# Patient Record
Sex: Male | Born: 1980
Health system: Southern US, Community
[De-identification: ages and names within clinical notes are randomized; demographics above are authoritative.]

---

## 2014-11-08 ENCOUNTER — Emergency Department (HOSPITAL_COMMUNITY): Payer: Worker's Compensation

## 2014-11-08 ENCOUNTER — Emergency Department (HOSPITAL_COMMUNITY)
Admission: EM | Admit: 2014-11-08 | Discharge: 2014-11-08 | Disposition: A | Discharge status: Home / Self Care | Payer: Worker's Compensation | Attending: Emergency Medicine | Admitting: Emergency Medicine

## 2014-11-08 ENCOUNTER — Encounter (HOSPITAL_COMMUNITY): Payer: Self-pay

## 2014-11-08 DIAGNOSIS — W260XXA Contact with knife, initial encounter: Secondary | ICD-10-CM | POA: Insufficient documentation

## 2014-11-08 DIAGNOSIS — Y9389 Activity, other specified: Secondary | ICD-10-CM | POA: Insufficient documentation

## 2014-11-08 DIAGNOSIS — S61012A Laceration without foreign body of left thumb without damage to nail, initial encounter: Secondary | ICD-10-CM | POA: Insufficient documentation

## 2014-11-08 DIAGNOSIS — Y929 Unspecified place or not applicable: Secondary | ICD-10-CM | POA: Insufficient documentation

## 2014-11-08 DIAGNOSIS — Z23 Encounter for immunization: Secondary | ICD-10-CM | POA: Diagnosis not present

## 2014-11-08 DIAGNOSIS — Y990 Civilian activity done for income or pay: Secondary | ICD-10-CM | POA: Diagnosis not present

## 2014-11-08 DIAGNOSIS — S61412A Laceration without foreign body of left hand, initial encounter: Secondary | ICD-10-CM

## 2014-11-08 MED ORDER — LIDOCAINE HCL (PF) 1 % IJ SOLN
5.0000 mL | Freq: Once | INTRAMUSCULAR | Status: AC
  Administered 2014-11-08: 5 mL
  Filled 2014-11-08: qty 5

## 2014-11-08 MED ORDER — TRAMADOL HCL 50 MG PO TABS
50.0000 mg | ORAL_TABLET | Freq: Once | ORAL | Status: AC
  Administered 2014-11-08: 50 mg via ORAL
  Filled 2014-11-08: qty 1

## 2014-11-08 MED ORDER — BACITRACIN 500 UNIT/GM EX OINT
1.0000 "application " | TOPICAL_OINTMENT | Freq: Once | CUTANEOUS | Status: AC
  Administered 2014-11-08: 1 via TOPICAL
  Filled 2014-11-08: qty 0.9

## 2014-11-08 MED ORDER — TETANUS-DIPHTH-ACELL PERTUSSIS 5-2.5-18.5 LF-MCG/0.5 IM SUSP
0.5000 mL | Freq: Once | INTRAMUSCULAR | Status: AC
  Administered 2014-11-08: 0.5 mL via INTRAMUSCULAR
  Filled 2014-11-08: qty 0.5

## 2014-11-08 MED ORDER — TRAMADOL HCL 50 MG PO TABS: 50.0000 mg | ORAL_TABLET | Freq: Four times a day (QID) | ORAL | Status: AC | PRN

## 2014-11-08 NOTE — ED Notes (Signed)
PA at the bedside.

## 2014-11-08 NOTE — ED Notes (Signed)
Pt. States he was working and cut his left thumb with a knife, bandage on and bleeding controlled in triage. Able to move all fingers, cap refill <3 sec.

## 2014-11-08 NOTE — ED Provider Notes (Signed)
CSN: [id number]     Arrival date & time 11/08/14  2041 History  This chart was scribed for Tyler FinnerErin O'[name], PA-C, working with Tyler RudeNathan R. Miller PayorPickering, MD by Chestine SporeSoijett Miller, ED Scribe. The patient was seen in room TR10C/TR10C at 9:39 PM.    Chief Complaint  Patient presents with  . Extremity Laceration      The history is provided by the patient. No language interpreter was used.    HPI Comments: Tyler Miller is a 34 y.o. male who presents to the Emergency Department complaining of extremity laceration of the left thumb onset tonight 7:30 PM PTA. Pt was working and cut his left thumb with a knife while cutting plastic. Pt rates the pain as 6/10. Pt denies the knife going deep. Pt denies any other cuts. He denies numbness/tingling, and any other symptoms. Pt is not sure of his last tetanus shot. Denies any allergies to medications. Pt has not had stitches before. Pt denies having a PCP. Pt sells plastic.    History reviewed. No pertinent past medical history. History reviewed. No pertinent past surgical history. No family history on file. History  Substance Use Topics  . Smoking status: Not on file  . Smokeless tobacco: Not on file  . Alcohol Use: Not on file    Review of Systems  Skin: Positive for wound. Negative for color change.      Allergies  Review of patient's allergies indicates no known allergies.  Home Medications   Prior to Admission medications   Not on File   BP 137/86 mmHg  Pulse 71  Temp(Src) 99.4 F (37.4 C) (Oral)  Resp 22  SpO2 99%  Physical Exam  Constitutional: He is oriented to person, place, and time. He appears well-developed and well-nourished.  HENT:  Head: Normocephalic and atraumatic.  Eyes: EOM are normal.  Neck: Normal range of motion.  Cardiovascular: Normal rate.   Cap refill is less than 3 seconds  Pulmonary/Chest: Effort normal.  Musculoskeletal: Normal range of motion.  Left thumb: Full ROM. 4/5 strength due to pain.   Neurological: He  is alert and oriented to person, place, and time. No sensory deficit.  Sensation intact  Skin: Skin is warm and dry. Laceration noted. No bruising noted.  Left hand: base of the left thumb dorsal aspect, 1 cm linear laceration. Bleeding controlled with pressure. No tendon involvement. Small arterial bleed, that is controlled with pressure. No bruising. No foreign bodies seen.   Psychiatric: He has a normal mood and affect. His behavior is normal.  Nursing note and vitals reviewed.   ED Course  Procedures   LACERATION REPAIR Performed by: Tyler Miller'[name], Kahleb Mcclane A. Authorized by: Ina Homes'[name], [name] A. Consent: Verbal consent obtained. Risks and benefits: risks, benefits and alternatives were discussed Consent given by: patient Patient identity confirmed: provided demographic data Prepped and Draped in normal sterile fashion Wound explored  Laceration Location: base of left thumb, dorsal aspect  Laceration Length: 1cm  No Foreign Bodies seen or palpated  Anesthesia: local infiltration  Local anesthetic: lidocaine 1% without epinephrine  Anesthetic total: 0.5 ml  Irrigation method: syringe Amount of cleaning: standard  Skin closure: 4-0 prolene  Number of sutures: 3  Technique: interrupted   Patient tolerance: Patient tolerated the procedure well with no immediate complications.   DIAGNOSTIC STUDIES: Oxygen Saturation is 99% on room air, normal by my interpretation.    COORDINATION OF CARE: 9:50 PM-Discussed treatment plan which includes left hand x-ray, laceration repair, f/u in 7-10 days for suture  removal with pt at bedside and pt agreed to plan.   Labs Review Labs Reviewed - No data to display  Imaging Review Dg Hand Complete Left  11/08/2014   CLINICAL DATA:  Cut left posterior thumb with a knife. Initial encounter.  EXAM: LEFT HAND - COMPLETE 3+ VIEW  COMPARISON:  None.  FINDINGS: There is no evidence of osseous disruption. No radiopaque foreign bodies are seen. A  dressing is noted overlying the site of laceration. Soft tissue disruption is not well characterized on radiograph.  Visualized joint spaces are grossly preserved. The carpal rows appear grossly intact, with a subcortical cyst noted at the distal scaphoid.  IMPRESSION: No evidence of osseous disruption. No radiopaque foreign bodies seen.   Electronically Signed   By: [name] M.D.   On: 11/08/2014 21:29     EKG Interpretation None      MDM   Final diagnoses:  None    Laceration to base of left thumb, dorsal aspect. Left hand and thumb neurovascularly in tact. Tdap given in ED.  Laceration sutured w/o immediate complication. Home care instructions provided. Advised to have sutures removed in 7-10 days. Return precautions provided. Pt verbalized understanding and agreement with tx plan.   I personally performed the services described in this documentation, which was scribed in my presence. The recorded information has been reviewed and is accurate.   Tyler Finner, PA-C 11/08/14 2229  Tyler Miller. Miller Payor, MD 11/09/14 [phone]

## 2016-01-08 IMAGING — CR DG HAND COMPLETE 3+V*L*
3 series · 3 of 3 positions shown · non-contrast
Comparison: None.

CLINICAL DATA: Cut left posterior thumb with a knife. Initial
encounter.

EXAM:
LEFT HAND - COMPLETE 3+ VIEW

[x hand pa left]
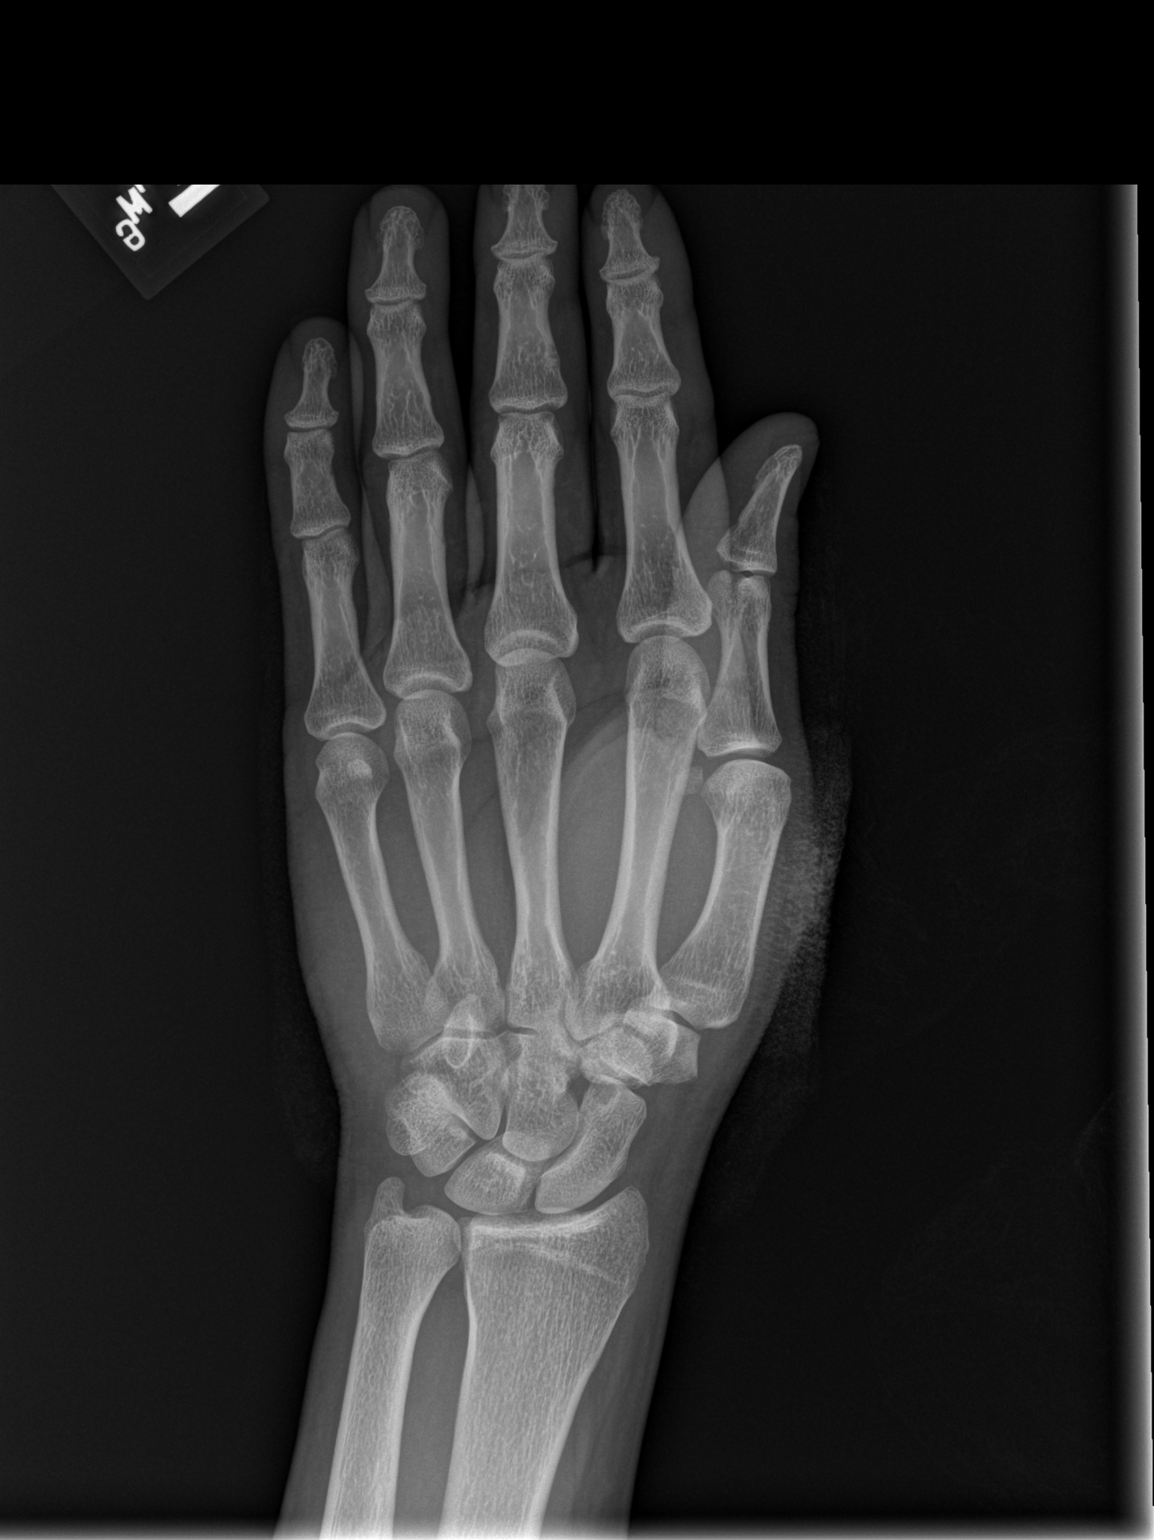

[x hand obl left]
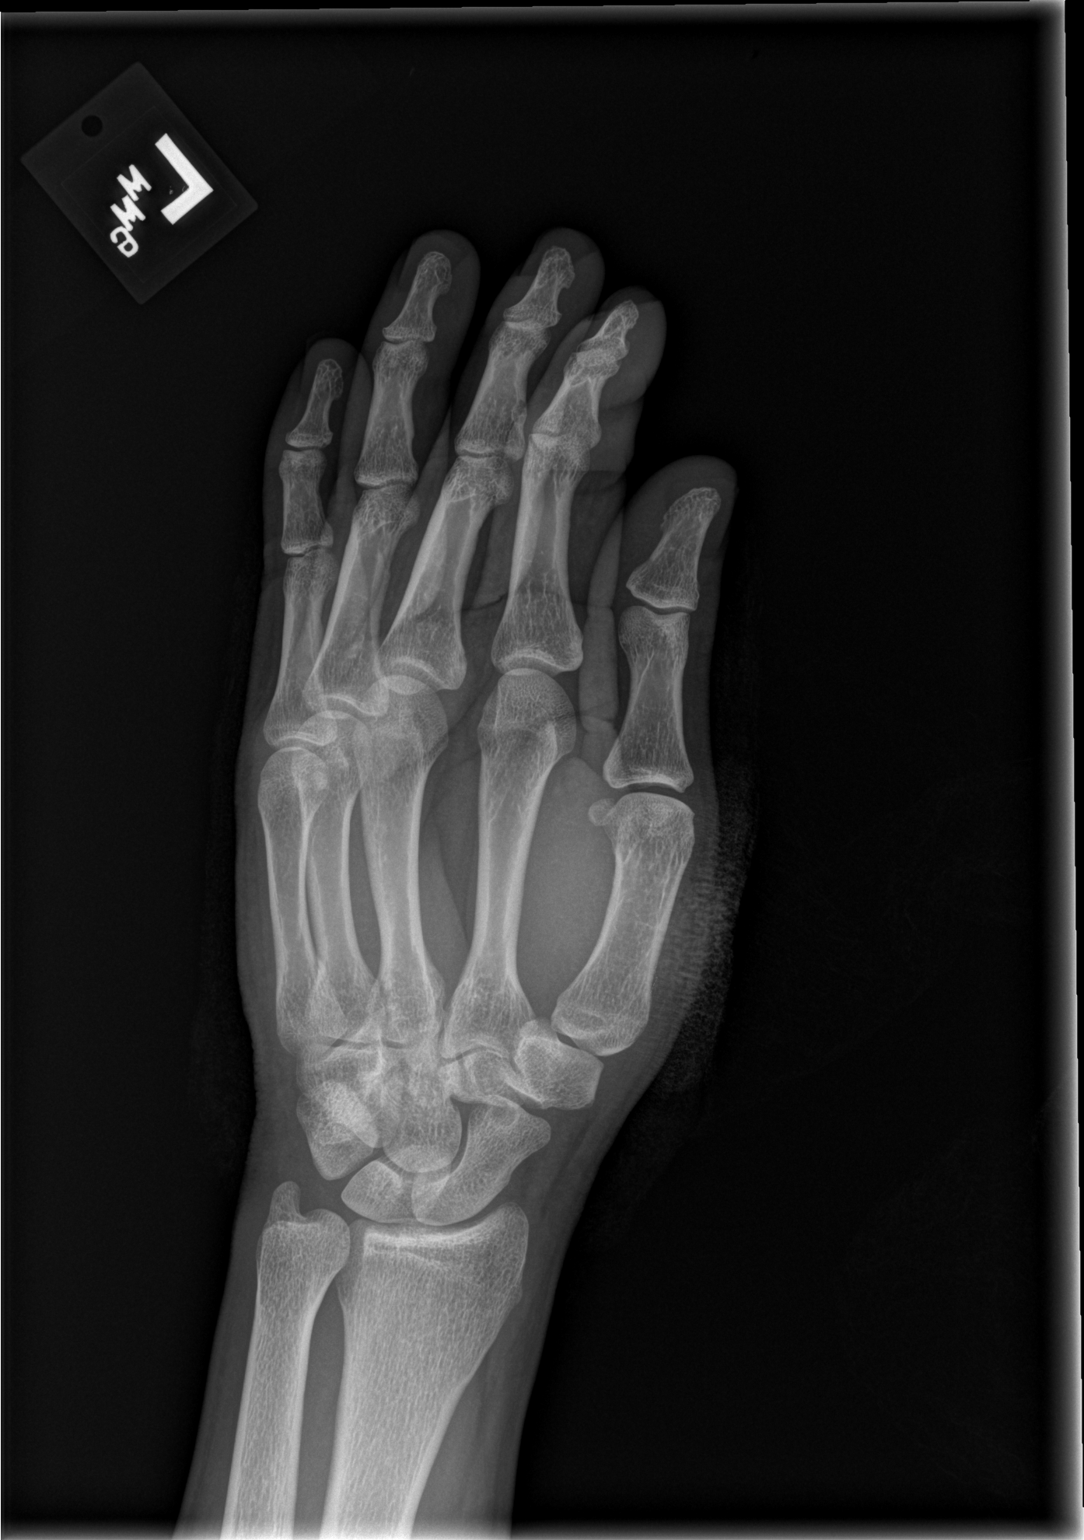

[x hand lat left]
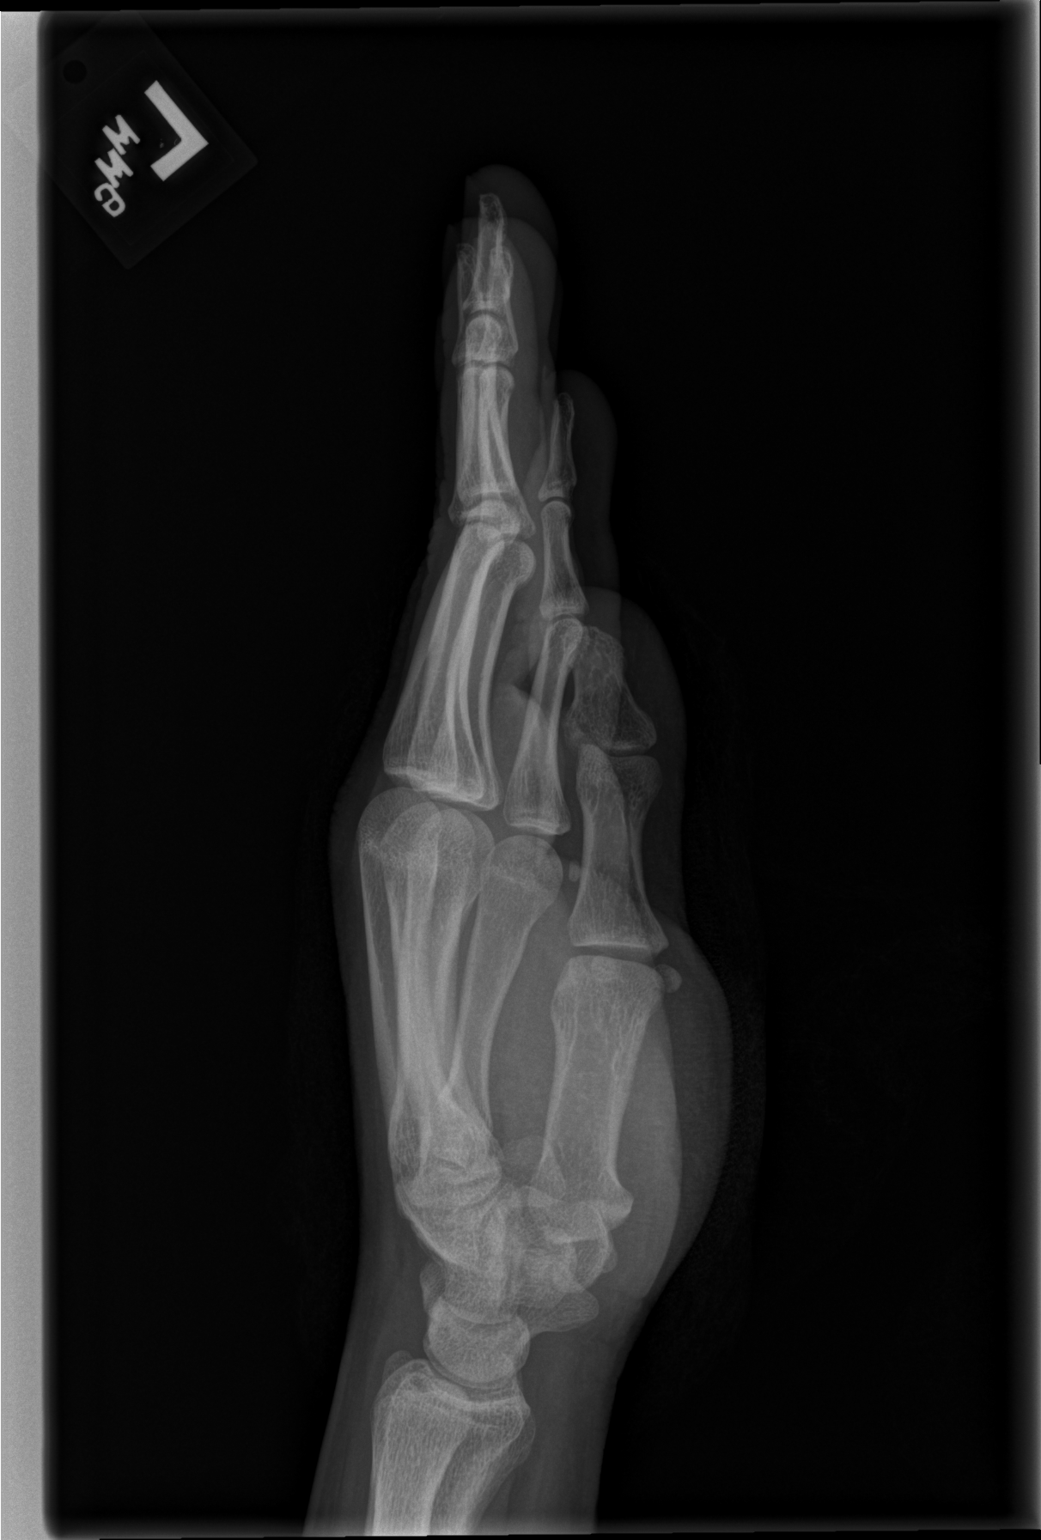

[3 of 3 positions shown; findings below may reference images not displayed]

FINDINGS: There is no evidence of osseous disruption. No radiopaque foreign
bodies are seen. A dressing is noted overlying the site of
laceration. Soft tissue disruption is not well characterized on
radiograph.

Visualized joint spaces are grossly preserved. The carpal rows
appear grossly intact, with a subcortical cyst noted at the distal
scaphoid.
IMPRESSION: No evidence of osseous disruption. No radiopaque foreign bodies
seen.

## 2017-07-04 ENCOUNTER — Ambulatory Visit: Payer: Self-pay | Admitting: Urgent Care

## 2017-08-14 ENCOUNTER — Other Ambulatory Visit: Payer: Self-pay

## 2017-08-14 ENCOUNTER — Encounter: Payer: Self-pay | Admitting: Emergency Medicine

## 2017-08-14 ENCOUNTER — Ambulatory Visit (INDEPENDENT_AMBULATORY_CARE_PROVIDER_SITE_OTHER): Payer: BLUE CROSS/BLUE SHIELD | Admitting: Emergency Medicine

## 2017-08-14 VITALS — BP 112/72 | HR 76 | Temp 98.7°F | Resp 16 | Ht 71.5 in | Wt 198.6 lb

## 2017-08-14 DIAGNOSIS — Z0000 Encounter for general adult medical examination without abnormal findings: Secondary | ICD-10-CM

## 2017-08-14 DIAGNOSIS — Z1329 Encounter for screening for other suspected endocrine disorder: Secondary | ICD-10-CM

## 2017-08-14 DIAGNOSIS — Z1321 Encounter for screening for nutritional disorder: Secondary | ICD-10-CM

## 2017-08-14 DIAGNOSIS — Z130 Encounter for screening for diseases of the blood and blood-forming organs and certain disorders involving the immune mechanism: Secondary | ICD-10-CM

## 2017-08-14 DIAGNOSIS — Z13228 Encounter for screening for other metabolic disorders: Secondary | ICD-10-CM

## 2017-08-14 NOTE — Progress Notes (Addendum)
Tyler Miller 36 y.o.   Chief Complaint  Patient presents with  . Annual Exam    HISTORY OF PRESENT ILLNESS: This is a 36 y.o. male Here for annual exam; no complaints and no medical concerns.  Smoking:no Drinking:no Sleeping:adequate Work:works second shift, regular hours Exercise:not enough Stress:high at present time Nutrition:good  HPI   Prior to Admission medications   Medication Sig Start Date End Date Taking? Authorizing Provider  traMADol (ULTRAM) 50 MG tablet Take 1 tablet (50 mg total) by mouth every 6 (six) hours as needed. 11/08/14   Lurene Shadow, PA-C    No Known Allergies  There are no active problems to display for this patient.   No past medical history on file.  No past surgical history on file.  Social History   Socioeconomic History  . Marital status: Single    Spouse name: Not on file  . Number of children: Not on file  . Years of education: Not on file  . Highest education level: Not on file  Social Needs  . Financial resource strain: Not on file  . Food insecurity - worry: Not on file  . Food insecurity - inability: Not on file  . Transportation needs - medical: Not on file  . Transportation needs - non-medical: Not on file  Occupational History  . Not on file  Tobacco Use  . Smoking status: Never Smoker  . Smokeless tobacco: Never Used  Substance and Sexual Activity  . Alcohol use: No    Frequency: Never  . Drug use: No  . Sexual activity: Not on file  Other Topics Concern  . Not on file  Social History Narrative  . Not on file    No family history on file.   Review of Systems  Constitutional: Negative.  Negative for chills, fever, malaise/fatigue and weight loss.  HENT: Negative.  Negative for congestion, nosebleeds and sore throat.   Eyes: Negative.  Negative for blurred vision, double vision, discharge and redness.  Respiratory: Negative.  Negative for cough and hemoptysis.   Cardiovascular: Negative.  Negative for  chest pain, palpitations, claudication and leg swelling.  Gastrointestinal: Negative.  Negative for abdominal pain, blood in stool, diarrhea, heartburn, nausea and vomiting.  Genitourinary: Negative for dysuria and hematuria.  Musculoskeletal: Negative.  Negative for back pain, myalgias and neck pain.  Skin: Negative.  Negative for rash.  Neurological: Negative.  Negative for dizziness and headaches.  Endo/Heme/Allergies: Negative.   All other systems reviewed and are negative.   Vitals:   08/14/17 1208  BP: 112/72  Pulse: 76  Resp: 16  Temp: 98.7 F (37.1 C)  SpO2: 99%    Physical Exam  Constitutional: He is oriented to person, place, and time. He appears well-developed and well-nourished.  HENT:  Head: Normocephalic and atraumatic.  Right Ear: External ear normal.  Left Ear: External ear normal.  Nose: Nose normal.  Mouth/Throat: Oropharynx is clear and moist.  Eyes: Conjunctivae and EOM are normal. Pupils are equal, round, and reactive to light.  Neck: Normal range of motion. Neck supple. No JVD present. No thyromegaly present.  Cardiovascular: Normal rate, regular rhythm, normal heart sounds and intact distal pulses.  Pulmonary/Chest: Effort normal and breath sounds normal.  Abdominal: Soft. Bowel sounds are normal. He exhibits no distension. There is no tenderness.  Musculoskeletal: Normal range of motion. He exhibits no edema or tenderness.  Lymphadenopathy:    He has no cervical adenopathy.  Neurological: He is alert and oriented to person,  place, and time. No sensory deficit. He exhibits normal muscle tone. Coordination normal.  Skin: Skin is warm. Capillary refill takes less than 2 seconds. No rash noted.  Psychiatric: He has a normal mood and affect. His behavior is normal.  Vitals reviewed.    ASSESSMENT & PLAN: Tyler Miller was seen today for annual exam.  Diagnoses and all orders for this visit:  Routine general medical examination at a health care facility -      CBC with Differential -     Comprehensive metabolic panel -     Hemoglobin A1c -     Lipid panel -     PSA(Must document that pt has been informed of limitations of PSA testing.) -     TSH -     HIV antibody  Screening for endocrine, nutritional, metabolic and immunity disorder     Patient Instructions       IF you received an x-ray today, you will receive an invoice from [facility]. Please contact [facility] at [phone] with questions or concerns regarding your invoice.   IF you received labwork today, you will receive an invoice from [facility]. Please contact [facility] at [phone] with questions or concerns regarding your invoice.   Our billing staff will not be able to assist you with questions regarding bills from these companies.  You will be contacted with the lab results as soon as they are available. The fastest way to get your results is to activate your My Chart account. Instructions are located on the last page of this paperwork. If you have not heard from us regarding the results in 2 weeks, please contact this office.      Health Maintenance, Male A healthy lifestyle and preventive care is important for your health and wellness. Ask your health care provider about what schedule of regular examinations is right for you. What should I know about weight and diet? Eat a Healthy Diet  Eat plenty of vegetables, fruits, whole grains, low-fat dairy products, and lean protein.  Do not eat a lot of foods high in solid fats, added sugars, or salt.  Maintain a Healthy Weight Regular exercise can help you achieve or maintain a healthy weight. You should:  Do at least 150 minutes of exercise each week. The exercise should increase your heart rate and make you sweat (moderate-intensity exercise).  Do strength-training exercises at least twice a week.  Watch Your Levels of Cholesterol and Blood Lipids  Have your blood tested for lipids and  cholesterol every 5 years starting at 37 years of age. If you are at high risk for heart disease, you should start having your blood tested when you are 37 years old. You may need to have your cholesterol levels checked more often if: ? Your lipid or cholesterol levels are high. ? You are older than 37 years of age. ? You are at high risk for heart disease.  What should I know about cancer screening? Many types of cancers can be detected early and may often be prevented. Lung Cancer  You should be screened every year for lung cancer if: ? You are a current smoker who has smoked for at least 30 years. ? You are a former smoker who has quit within the past 15 years.  Talk to your health care provider about your screening options, when you should start screening, and how often you should be screened.  Colorectal Cancer  Routine colorectal cancer screening usually begins at 37 years of  age and should be repeated every 5-10 years until you are 37 years old. You may need to be screened more often if early forms of precancerous polyps or small growths are found. Your health care provider may recommend screening at an earlier age if you have risk factors for colon cancer.  Your health care provider may recommend using home test kits to check for hidden blood in the stool.  A small camera at the end of a tube can be used to examine your colon (sigmoidoscopy or colonoscopy). This checks for the earliest forms of colorectal cancer.  Prostate and Testicular Cancer  Depending on your age and overall health, your health care provider may do certain tests to screen for prostate and testicular cancer.  Talk to your health care provider about any symptoms or concerns you have about testicular or prostate cancer.  Skin Cancer  Check your skin from head to toe regularly.  Tell your health care provider about any new moles or changes in moles, especially if: ? There is a change in a mole's size, shape,  or color. ? You have a mole that is larger than a pencil eraser.  Always use sunscreen. Apply sunscreen liberally and repeat throughout the day.  Protect yourself by wearing long sleeves, pants, a wide-brimmed hat, and sunglasses when outside.  What should I know about heart disease, diabetes, and high blood pressure?  If you are 5218-37 years of age, have your blood pressure checked every 3-5 years. If you are 540 years of age or older, have your blood pressure checked every year. You should have your blood pressure measured twice-once when you are at a hospital or clinic, and once when you are not at a hospital or clinic. Record the average of the two measurements. To check your blood pressure when you are not at a hospital or clinic, you can use: ? An automated blood pressure machine at a pharmacy. ? A home blood pressure monitor.  Talk to your health care provider about your target blood pressure.  If you are between 1645-37 years old, ask your health care provider if you should take aspirin to prevent heart disease.  Have regular diabetes screenings by checking your fasting blood sugar level. ? If you are at a normal weight and have a low risk for diabetes, have this test once every three years after the age of 37. ? If you are overweight and have a high risk for diabetes, consider being tested at a younger age or more often.  A one-time screening for abdominal aortic aneurysm (AAA) by ultrasound is recommended for men aged 65-75 years who are current or former smokers. What should I know about preventing infection? Hepatitis B If you have a higher risk for hepatitis B, you should be screened for this virus. Talk with your health care provider to find out if you are at risk for hepatitis B infection. Hepatitis C Blood testing is recommended for:  Everyone born from [id number] through 1966.  Anyone with known risk factors for hepatitis C.  Sexually Transmitted Diseases (STDs)  You should  be screened each year for STDs including gonorrhea and chlamydia if: ? You are sexually active and are younger than 37 years of age. ? You are older than 37 years of age and your health care provider tells you that you are at risk for this type of infection. ? Your sexual activity has changed since you were last screened and you are at an increased risk  for chlamydia or gonorrhea. Ask your health care provider if you are at risk.  Talk with your health care provider about whether you are at high risk of being infected with HIV. Your health care provider may recommend a prescription medicine to help prevent HIV infection.  What else can I do?  Schedule regular health, dental, and eye exams.  Stay current with your vaccines (immunizations).  Do not use any tobacco products, such as cigarettes, chewing tobacco, and e-cigarettes. If you need help quitting, ask your health care provider.  Limit alcohol intake to no more than 2 drinks per day. One drink equals 12 ounces of beer, 5 ounces of wine, or 1 ounces of hard liquor.  Do not use street drugs.  Do not share needles.  Ask your health care provider for help if you need support or information about quitting drugs.  Tell your health care provider if you often feel depressed.  Tell your health care provider if you have ever been abused or do not feel safe at home. This information is not intended to replace advice given to you by your health care provider. Make sure you discuss any questions you have with your health care provider. Document Released: 02/21/2008 Document Revised: 04/23/2016 Document Reviewed: 05/29/2015 Elsevier Interactive Patient Education  2018 [employer].  American Heart Association (AHA) Exercise Recommendation  Being physically active is important to prevent heart disease and stroke, the nation's No. 1and No. 5killers. To improve overall cardiovascular health, we suggest at least 150 minutes per week of moderate  exercise or 75 minutes per week of vigorous exercise (or a combination of moderate and vigorous activity). Thirty minutes a day, five times a week is an easy goal to remember. You will also experience benefits even if you divide your time into two or three segments of 10 to 15 minutes per day.  For people who would benefit from lowering their blood pressure or cholesterol, we recommend 40 minutes of aerobic exercise of moderate to vigorous intensity three to four times a week to lower the risk for heart attack and stroke.  Physical activity is anything that makes you move your body and burn calories.  This includes things like climbing stairs or playing sports. Aerobic exercises benefit your heart, and include walking, jogging, swimming or biking. Strength and stretching exercises are best for overall stamina and flexibility.  The simplest, positive change you can make to effectively improve your heart health is to start walking. It's enjoyable, free, easy, social and great exercise. A walking program is flexible and boasts high success rates because people can stick with it. It's easy for walking to become a regular and satisfying part of life.   For Overall Cardiovascular Health:  At least 30 minutes of moderate-intensity aerobic activity at least 5 days per week for a total of 150  OR   At least 25 minutes of vigorous aerobic activity at least 3 days per week for a total of 75 minutes; or a combination of moderate- and vigorous-intensity aerobic activity  AND   Moderate- to high-intensity muscle-strengthening activity at least 2 days per week for additional health benefits.  For Lowering Blood Pressure and Cholesterol  An average 40 minutes of moderate- to vigorous-intensity aerobic activity 3 or 4 times per week  What if I can't make it to the time goal? Something is always better than nothing! And everyone has to start somewhere. Even if you've been sedentary for years, today is  the day you  can begin to make healthy changes in your life. If you don't think you'll make it for 30 or 40 minutes, set a reachable goal for today. You can work up toward your overall goal by increasing your time as you get stronger. Don't let all-or-nothing thinking rob you of doing what you can every day.  Source:[email or web address], MD [facility]

## 2017-08-14 NOTE — Patient Instructions (Addendum)
   IF you received an x-ray today, you will receive an invoice from [facility]. Please contact [facility] at [phone] with questions or concerns regarding your invoice.   IF you received labwork today, you will receive an invoice from [facility]. Please contact [facility] at [phone] with questions or concerns regarding your invoice.   Our billing staff will not be able to assist you with questions regarding bills from these companies.  You will be contacted with the lab results as soon as they are available. The fastest way to get your results is to activate your My Chart account. Instructions are located on the last page of this paperwork. If you have not heard from us regarding the results in 2 weeks, please contact this office.      Health Maintenance, Male A healthy lifestyle and preventive care is important for your health and wellness. Ask your health care provider about what schedule of regular examinations is right for you. What should I know about weight and diet? Eat a Healthy Diet  Eat plenty of vegetables, fruits, whole grains, low-fat dairy products, and lean protein.  Do not eat a lot of foods high in solid fats, added sugars, or salt.  Maintain a Healthy Weight Regular exercise can help you achieve or maintain a healthy weight. You should:  Do at least 150 minutes of exercise each week. The exercise should increase your heart rate and make you sweat (moderate-intensity exercise).  Do strength-training exercises at least twice a week.  Watch Your Levels of Cholesterol and Blood Lipids  Have your blood tested for lipids and cholesterol every 5 years starting at 37 years of age. If you are at high risk for heart disease, you should start having your blood tested when you are 37 years old. You may need to have your cholesterol levels checked more often if: ? Your lipid or cholesterol levels are high. ? You are older than 37 years of age. ? You  are at high risk for heart disease.  What should I know about cancer screening? Many types of cancers can be detected early and may often be prevented. Lung Cancer  You should be screened every year for lung cancer if: ? You are a current smoker who has smoked for at least 30 years. ? You are a former smoker who has quit within the past 15 years.  Talk to your health care provider about your screening options, when you should start screening, and how often you should be screened.  Colorectal Cancer  Routine colorectal cancer screening usually begins at 37 years of age and should be repeated every 5-10 years until you are 37 years old. You may need to be screened more often if early forms of precancerous polyps or small growths are found. Your health care provider may recommend screening at an earlier age if you have risk factors for colon cancer.  Your health care provider may recommend using home test kits to check for hidden blood in the stool.  A small camera at the end of a tube can be used to examine your colon (sigmoidoscopy or colonoscopy). This checks for the earliest forms of colorectal cancer.  Prostate and Testicular Cancer  Depending on your age and overall health, your health care provider may do certain tests to screen for prostate and testicular cancer.  Talk to your health care provider about any symptoms or concerns you have about testicular or prostate cancer.  Skin Cancer  Check your skin   from head to toe regularly.  Tell your health care provider about any new moles or changes in moles, especially if: ? There is a change in a mole's size, shape, or color. ? You have a mole that is larger than a pencil eraser.  Always use sunscreen. Apply sunscreen liberally and repeat throughout the day.  Protect yourself by wearing long sleeves, pants, a wide-brimmed hat, and sunglasses when outside.  What should I know about heart disease, diabetes, and high blood  pressure?  If you are 18-39 years of age, have your blood pressure checked every 3-5 years. If you are 40 years of age or older, have your blood pressure checked every year. You should have your blood pressure measured twice-once when you are at a hospital or clinic, and once when you are not at a hospital or clinic. Record the average of the two measurements. To check your blood pressure when you are not at a hospital or clinic, you can use: ? An automated blood pressure machine at a pharmacy. ? A home blood pressure monitor.  Talk to your health care provider about your target blood pressure.  If you are between 45-79 years old, ask your health care provider if you should take aspirin to prevent heart disease.  Have regular diabetes screenings by checking your fasting blood sugar level. ? If you are at a normal weight and have a low risk for diabetes, have this test once every three years after the age of 45. ? If you are overweight and have a high risk for diabetes, consider being tested at a younger age or more often.  A one-time screening for abdominal aortic aneurysm (AAA) by ultrasound is recommended for men aged 65-75 years who are current or former smokers. What should I know about preventing infection? Hepatitis B If you have a higher risk for hepatitis B, you should be screened for this virus. Talk with your health care provider to find out if you are at risk for hepatitis B infection. Hepatitis C Blood testing is recommended for:  Everyone born from 1945 through 1965.  Anyone with known risk factors for hepatitis C.  Sexually Transmitted Diseases (STDs)  You should be screened each year for STDs including gonorrhea and chlamydia if: ? You are sexually active and are younger than 37 years of age. ? You are older than 37 years of age and your health care provider tells you that you are at risk for this type of infection. ? Your sexual activity has changed since you were last  screened and you are at an increased risk for chlamydia or gonorrhea. Ask your health care provider if you are at risk.  Talk with your health care provider about whether you are at high risk of being infected with HIV. Your health care provider may recommend a prescription medicine to help prevent HIV infection.  What else can I do?  Schedule regular health, dental, and eye exams.  Stay current with your vaccines (immunizations).  Do not use any tobacco products, such as cigarettes, chewing tobacco, and e-cigarettes. If you need help quitting, ask your health care provider.  Limit alcohol intake to no more than 2 drinks per day. One drink equals 12 ounces of beer, 5 ounces of wine, or 1 ounces of hard liquor.  Do not use street drugs.  Do not share needles.  Ask your health care provider for help if you need support or information about quitting drugs.  Tell your health care   provider if you often feel depressed.  Tell your health care provider if you have ever been abused or do not feel safe at home. This information is not intended to replace advice given to you by your health care provider. Make sure you discuss any questions you have with your health care provider. Document Released: 02/21/2008 Document Revised: 04/23/2016 Document Reviewed: 05/29/2015 Elsevier Interactive Patient Education  2018 Elsevier Inc.  American Heart Association (AHA) Exercise Recommendation  Being physically active is important to prevent heart disease and stroke, the nation's No. 1and No. 5killers. To improve overall cardiovascular health, we suggest at least 150 minutes per week of moderate exercise or 75 minutes per week of vigorous exercise (or a combination of moderate and vigorous activity). Thirty minutes a day, five times a week is an easy goal to remember. You will also experience benefits even if you divide your time into two or three segments of 10 to 15 minutes per day.  For people who would  benefit from lowering their blood pressure or cholesterol, we recommend 40 minutes of aerobic exercise of moderate to vigorous intensity three to four times a week to lower the risk for heart attack and stroke.  Physical activity is anything that makes you move your body and burn calories.  This includes things like climbing stairs or playing sports. Aerobic exercises benefit your heart, and include walking, jogging, swimming or biking. Strength and stretching exercises are best for overall stamina and flexibility.  The simplest, positive change you can make to effectively improve your heart health is to start walking. It's enjoyable, free, easy, social and great exercise. A walking program is flexible and boasts high success rates because people can stick with it. It's easy for walking to become a regular and satisfying part of life.   For Overall Cardiovascular Health:  At least 30 minutes of moderate-intensity aerobic activity at least 5 days per week for a total of 150  OR   At least 25 minutes of vigorous aerobic activity at least 3 days per week for a total of 75 minutes; or a combination of moderate- and vigorous-intensity aerobic activity  AND   Moderate- to high-intensity muscle-strengthening activity at least 2 days per week for additional health benefits.  For Lowering Blood Pressure and Cholesterol  An average 40 minutes of moderate- to vigorous-intensity aerobic activity 3 or 4 times per week  What if I can't make it to the time goal? Something is always better than nothing! And everyone has to start somewhere. Even if you've been sedentary for years, today is the day you can begin to make healthy changes in your life. If you don't think you'll make it for 30 or 40 minutes, set a reachable goal for today. You can work up toward your overall goal by increasing your time as you get stronger. Don't let all-or-nothing thinking rob you of doing what you can every day.   Source:http://www.heart.org    

## 2017-08-15 LAB — CBC WITH DIFFERENTIAL/PLATELET
Basophils Absolute: 0 10*3/uL (ref 0.0–0.2)
Basos: 0 %
EOS (ABSOLUTE): 0.3 10*3/uL (ref 0.0–0.4)
Eos: 5 %
Hematocrit: 48.5 % (ref 37.5–51.0)
Hemoglobin: 16.6 g/dL (ref 13.0–17.7)
Immature Grans (Abs): 0 10*3/uL (ref 0.0–0.1)
Immature Granulocytes: 0 %
Lymphocytes Absolute: 1.6 10*3/uL (ref 0.7–3.1)
Lymphs: 25 %
MCH: 29.7 pg (ref 26.6–33.0)
MCHC: 34.2 g/dL (ref 31.5–35.7)
MCV: 87 fL (ref 79–97)
Monocytes Absolute: 0.4 10*3/uL (ref 0.1–0.9)
Monocytes: 7 %
NEUTROS ABS: 3.9 10*3/uL (ref 1.4–7.0)
Neutrophils: 63 %
Platelets: 261 10*3/uL (ref 150–379)
RBC: 5.59 x10E6/uL (ref 4.14–5.80)
RDW: 13.2 % (ref 12.3–15.4)
WBC: 6.3 10*3/uL (ref 3.4–10.8)

## 2017-08-15 LAB — HEMOGLOBIN A1C
Est. average glucose Bld gHb Est-mCnc: 103 mg/dL
HEMOGLOBIN A1C: 5.2 % (ref 4.8–5.6)

## 2017-08-15 LAB — LIPID PANEL
CHOLESTEROL TOTAL: 203 mg/dL — AB (ref 100–199)
Chol/HDL Ratio: 4.3 ratio (ref 0.0–5.0)
HDL: 47 mg/dL (ref >39)
LDL Calculated: 137 mg/dL — ABNORMAL HIGH (ref 0–99)
TRIGLYCERIDES: 96 mg/dL (ref 0–149)
VLDL Cholesterol Cal: 19 mg/dL (ref 5–40)

## 2017-08-15 LAB — COMPREHENSIVE METABOLIC PANEL
A/G RATIO: 1.6 (ref 1.2–2.2)
ALT: 27 IU/L (ref 0–44)
AST: 18 IU/L (ref 0–40)
Albumin: 4.7 g/dL (ref 3.5–5.5)
Alkaline Phosphatase: 83 IU/L (ref 39–117)
BILIRUBIN TOTAL: 0.5 mg/dL (ref 0.0–1.2)
BUN/Creatinine Ratio: 17 (ref 9–20)
BUN: 17 mg/dL (ref 6–20)
CHLORIDE: 104 mmol/L (ref 96–106)
CO2: 25 mmol/L (ref 20–29)
Calcium: 9.8 mg/dL (ref 8.7–10.2)
Creatinine, Ser: 0.99 mg/dL (ref 0.76–1.27)
GFR calc Af Amer: 112 mL/min/{1.73_m2} (ref >59)
GFR calc non Af Amer: 97 mL/min/{1.73_m2} (ref >59)
Globulin, Total: 2.9 g/dL (ref 1.5–4.5)
Glucose: 100 mg/dL — ABNORMAL HIGH (ref 65–99)
POTASSIUM: 4.5 mmol/L (ref 3.5–5.2)
Sodium: 142 mmol/L (ref 134–144)
Total Protein: 7.6 g/dL (ref 6.0–8.5)

## 2017-08-15 LAB — HIV ANTIBODY (ROUTINE TESTING W REFLEX): HIV Screen 4th Generation wRfx: NONREACTIVE

## 2017-08-15 LAB — PSA: Prostate Specific Ag, Serum: 0.9 ng/mL (ref 0.0–4.0)

## 2017-08-15 LAB — TSH: TSH: 1 u[IU]/mL (ref 0.450–4.500)

## 2017-08-19 ENCOUNTER — Encounter: Payer: Self-pay | Admitting: Radiology

## 2017-10-22 ENCOUNTER — Telehealth: Payer: Self-pay | Admitting: Emergency Medicine

## 2017-10-22 NOTE — Telephone Encounter (Signed)
This patient came in with questions about his [facility] bill.  Tyler Miller added preventative dx codes for labs on dos 08-14-17.  [facility] so that they can resubmit the claim to his insurance. 10-22-17

## 2018-08-26 ENCOUNTER — Other Ambulatory Visit: Payer: Self-pay

## 2018-08-26 ENCOUNTER — Encounter: Payer: Self-pay | Admitting: Emergency Medicine

## 2018-08-26 ENCOUNTER — Ambulatory Visit (INDEPENDENT_AMBULATORY_CARE_PROVIDER_SITE_OTHER): Payer: BLUE CROSS/BLUE SHIELD | Admitting: Emergency Medicine

## 2018-08-26 VITALS — BP 136/92 | HR 60 | Temp 98.8°F | Resp 16 | Ht 71.0 in | Wt 216.0 lb

## 2018-08-26 DIAGNOSIS — Z0000 Encounter for general adult medical examination without abnormal findings: Secondary | ICD-10-CM

## 2018-08-26 NOTE — Patient Instructions (Addendum)

## 2018-08-26 NOTE — Progress Notes (Signed)
Tyler Miller 37 y.o.   Chief Complaint  Patient presents with  . Annual Exam    HISTORY OF PRESENT ILLNESS: This is a 37 y.o. male here for his annual exam.  No complaints or medical concerns today.  No chronic medical problems.  No chronic medications. Non-smoker.  No EtOH user.  Physically active.  Good nutrition.  Healthy lifestyle.   HPI   Prior to Admission medications   Medication Sig Start Date End Date Taking? Authorizing Provider  traMADol (ULTRAM) 50 MG tablet Take 1 tablet (50 mg total) by mouth every 6 (six) hours as needed. Patient not taking: Reported on 08/26/2018 11/08/14   Tyler Shadow, PA-C    No Known Allergies  There are no active problems to display for this patient.   No past medical history on file.  No past surgical history on file.  Social History   Socioeconomic History  . Marital status: Single    Spouse name: Not on file  . Number of children: Not on file  . Years of education: Not on file  . Highest education level: Not on file  Occupational History  . Not on file  Social Needs  . Financial resource strain: Not on file  . Food insecurity:    Worry: Not on file    Inability: Not on file  . Transportation needs:    Medical: Not on file    Non-medical: Not on file  Tobacco Use  . Smoking status: Never Smoker  . Smokeless tobacco: Never Used  Substance and Sexual Activity  . Alcohol use: No    Frequency: Never  . Drug use: No  . Sexual activity: Not on file  Lifestyle  . Physical activity:    Days per week: Not on file    Minutes per session: Not on file  . Stress: Not on file  Relationships  . Social connections:    Talks on phone: Not on file    Gets together: Not on file    Attends religious service: Not on file    Active member of club or organization: Not on file    Attends meetings of clubs or organizations: Not on file    Relationship status: Not on file  . Intimate partner violence:    Fear of current or ex  partner: Not on file    Emotionally abused: Not on file    Physically abused: Not on file    Forced sexual activity: Not on file  Other Topics Concern  . Not on file  Social History Narrative  . Not on file    No family history on file.   Review of Systems  Constitutional: Negative.  Negative for chills, fever and weight loss.  HENT: Negative for congestion, hearing loss, nosebleeds and sore throat.   Eyes: Negative.  Negative for blurred vision and double vision.  Respiratory: Negative.  Negative for cough and shortness of breath.   Cardiovascular: Negative.  Negative for chest pain and palpitations.  Gastrointestinal: Negative.  Negative for abdominal pain, blood in stool, diarrhea, melena, nausea and vomiting.  Genitourinary: Negative.  Negative for dysuria, flank pain and hematuria.  Musculoskeletal: Negative.   Skin: Negative.  Negative for rash.  Neurological: Negative.  Negative for dizziness and headaches.  Endo/Heme/Allergies: Negative.   All other systems reviewed and are negative.  Vitals:   08/26/18 1337  BP: (!) 136/92  Pulse: 60  Resp: 16  Temp: 98.8 F (37.1 C)  SpO2: 99%  Physical Exam Vitals signs reviewed.  Constitutional:      Appearance: Normal appearance.  HENT:     Head: Normocephalic.     Right Ear: External ear normal.     Left Ear: External ear normal.     Nose: Nose normal.     Mouth/Throat:     Mouth: Mucous membranes are moist.     Pharynx: Oropharynx is clear.  Eyes:     Extraocular Movements: Extraocular movements intact.     Conjunctiva/sclera: Conjunctivae normal.     Pupils: Pupils are equal, round, and reactive to light.  Neck:     Musculoskeletal: Normal range of motion and neck supple.  Cardiovascular:     Rate and Rhythm: Normal rate and regular rhythm.     Pulses: Normal pulses.     Heart sounds: Normal heart sounds.  Pulmonary:     Effort: Pulmonary effort is normal.     Breath sounds: Normal breath sounds.    Abdominal:     General: Abdomen is flat. There is no distension.     Palpations: Abdomen is soft. There is no mass.     Tenderness: There is no abdominal tenderness.  Musculoskeletal: Normal range of motion.        General: No swelling or tenderness.  Lymphadenopathy:     Cervical: No cervical adenopathy.  Skin:    General: Skin is warm and dry.     Capillary Refill: Capillary refill takes less than 2 seconds.  Neurological:     General: No focal deficit present.     Mental Status: He is alert and oriented to person, place, and time.     Sensory: No sensory deficit.     Motor: No weakness.     Coordination: Coordination normal.  Psychiatric:        Mood and Affect: Mood normal.        Behavior: Behavior normal.      ASSESSMENT & PLAN: Tyler Miller was seen today for annual exam.  Diagnoses and all orders for this visit:  Routine general medical examination at a health care facility  Normal exam    Patient Instructions       If you have lab work done today you will be contacted with your lab results within the next 2 weeks.  If you have not heard from us then please contact us. The fastest way to get your results is to register for My Chart.   IF you received an x-ray today, you will receive an invoice [facility]. Please contact [facility] at [phone] with questions or concerns regarding your invoice.   IF you received labwork today, you will receive an invoice from [facility]. Please contact [facility] at [phone] with questions or concerns regarding your invoice.   Our billing staff will not be able to assist you with questions regarding bills from these companies.  You will be contacted with the lab results as soon as they are available. The fastest way to get your results is to activate your My Chart account. Instructions are located on the last page of this paperwork. If you have not heard from us regarding the results in 2 weeks, please  contact this office.     Health Maintenance, Male A healthy lifestyle and preventive care is important for your health and wellness. Ask your health care provider about what schedule of regular examinations is right for you. What should I know about weight and diet? Eat a Healthy Diet  Eat  plenty of vegetables, fruits, whole grains, low-fat dairy products, and lean protein.  Do not eat a lot of foods high in solid fats, added sugars, or salt.  Maintain a Healthy Weight Regular exercise can help you achieve or maintain a healthy weight. You should:  Do at least 150 minutes of exercise each week. The exercise should increase your heart rate and make you sweat (moderate-intensity exercise).  Do strength-training exercises at least twice a week. Watch Your Levels of Cholesterol and Blood Lipids  Have your blood tested for lipids and cholesterol every 5 years starting at 38 years of age. If you are at high risk for heart disease, you should start having your blood tested when you are 38 years old. You may need to have your cholesterol levels checked more often if: ? Your lipid or cholesterol levels are high. ? You are older than 38 years of age. ? You are at high risk for heart disease. What should I know about cancer screening? Many types of cancers can be detected early and may often be prevented. Lung Cancer  You should be screened every year for lung cancer if: ? You are a current smoker who has smoked for at least 30 years. ? You are a former smoker who has quit within the past 15 years.  Talk to your health care provider about your screening options, when you should start screening, and how often you should be screened. Colorectal Cancer  Routine colorectal cancer screening usually begins at 10650 years of age and should be repeated every 5-10 years until you are 38 years old. You may need to be screened more often if early forms of precancerous polyps or small growths are found.  Your health care provider may recommend screening at an earlier age if you have risk factors for colon cancer.  Your health care provider may recommend using home test kits to check for hidden blood in the stool.  A small camera at the end of a tube can be used to examine your colon (sigmoidoscopy or colonoscopy). This checks for the earliest forms of colorectal cancer. Prostate and Testicular Cancer  Depending on your age and overall health, your health care provider may do certain tests to screen for prostate and testicular cancer.  Talk to your health care provider about any symptoms or concerns you have about testicular or prostate cancer. Skin Cancer  Check your skin from head to toe regularly.  Tell your health care provider about any new moles or changes in moles, especially if: ? There is a change in a mole's size, shape, or color. ? You have a mole that is larger than a pencil eraser.  Always use sunscreen. Apply sunscreen liberally and repeat throughout the day.  Protect yourself by wearing long sleeves, pants, a wide-brimmed hat, and sunglasses when outside. What should I know about heart disease, diabetes, and high blood pressure?  If you are 3618-38 years of age, have your blood pressure checked every 3-5 years. If you are 38 years of age or older, have your blood pressure checked every year. You should have your blood pressure measured twice-once when you are at a hospital or clinic, and once when you are not at a hospital or clinic. Record the average of the two measurements. To check your blood pressure when you are not at a hospital or clinic, you can use: ? An automated blood pressure machine at a pharmacy. ? A home blood pressure monitor.  Talk to your  health care provider about your target blood pressure.  If you are between 46-74 years old, ask your health care provider if you should take aspirin to prevent heart disease.  Have regular diabetes screenings by checking  your fasting blood sugar level. ? If you are at a normal weight and have a low risk for diabetes, have this test once every three years after the age of 52. ? If you are overweight and have a high risk for diabetes, consider being tested at a younger age or more often.  A one-time screening for abdominal aortic aneurysm (AAA) by ultrasound is recommended for men aged 65-75 years who are current or former smokers. What should I know about preventing infection? Hepatitis B If you have a higher risk for hepatitis B, you should be screened for this virus. Talk with your health care provider to find out if you are at risk for hepatitis B infection. Hepatitis C Blood testing is recommended for:  Everyone born from [birth year] through 1965.  Anyone with known risk factors for hepatitis C. Sexually Transmitted Diseases (STDs)  You should be screened each year for STDs including gonorrhea and chlamydia if: ? You are sexually active and are younger than 38 years of age. ? You are older than 38 years of age and your health care provider tells you that you are at risk for this type of infection. ? Your sexual activity has changed since you were last screened and you are at an increased risk for chlamydia or gonorrhea. Ask your health care provider if you are at risk.  Talk with your health care provider about whether you are at high risk of being infected with HIV. Your health care provider may recommend a prescription medicine to help prevent HIV infection. What else can I do?  Schedule regular health, dental, and eye exams.  Stay current with your vaccines (immunizations).  Do not use any tobacco products, such as cigarettes, chewing tobacco, and e-cigarettes. If you need help quitting, ask your health care provider.  Limit alcohol intake to no more than 2 drinks per day. One drink equals 12 ounces of beer, 5 ounces of wine, or 1 ounces of hard liquor.  Do not use street drugs.  Do not share  needles.  Ask your health care provider for help if you need support or information about quitting drugs.  Tell your health care provider if you often feel depressed.  Tell your health care provider if you have ever been abused or do not feel safe at home. This information is not intended to replace advice given to you by your health care provider. Make sure you discuss any questions you have with your health care provider. Document Released: 02/21/2008 Document Revised: 04/23/2016 Document Reviewed: 05/29/2015 Elsevier Interactive Patient Education  2019 Elsevier Inc.      Edwina Barth, MD [facility]

## 2019-08-30 ENCOUNTER — Encounter: Payer: Self-pay | Admitting: Emergency Medicine

## 2019-08-30 ENCOUNTER — Ambulatory Visit (INDEPENDENT_AMBULATORY_CARE_PROVIDER_SITE_OTHER): Payer: BC Managed Care – PPO | Admitting: Emergency Medicine

## 2019-08-30 ENCOUNTER — Other Ambulatory Visit: Payer: Self-pay

## 2019-08-30 VITALS — BP 114/95 | HR 66 | Temp 98.2°F | Resp 16 | Ht 72.5 in | Wt 207.0 lb

## 2019-08-30 DIAGNOSIS — Z0000 Encounter for general adult medical examination without abnormal findings: Secondary | ICD-10-CM

## 2019-08-30 NOTE — Progress Notes (Signed)
Tyler Miller 38 y.o.   Chief Complaint  Patient presents with  . Annual Exam    HISTORY OF PRESENT ILLNESS: This is a 38 y.o. male here for annual exam. Has no complaints or medical concerns. Healthy male with a healthy lifestyle. No chronic medical problems, on no chronic medications. Non-smoker and no EtOH user. Prefers not to have blood work done today.  HPI   Prior to Admission medications   Medication Sig Start Date End Date Taking? Authorizing Provider  traMADol (ULTRAM) 50 MG tablet Take 1 tablet (50 mg total) by mouth every 6 (six) hours as needed. Patient not taking: Reported on 08/26/2018 11/08/14   Tyler Shadow, PA-C    No Known Allergies  There are no problems to display for this patient.   No past medical history on file.  No past surgical history on file.  Social History   Socioeconomic History  . Marital status: Single    Spouse name: Not on file  . Number of children: Not on file  . Years of education: Not on file  . Highest education level: Not on file  Occupational History  . Occupation: Location manager  Tobacco Use  . Smoking status: Never Smoker  . Smokeless tobacco: Never Used  Substance and Sexual Activity  . Alcohol use: No  . Drug use: No  . Sexual activity: Not on file  Other Topics Concern  . Not on file  Social History Narrative  . Not on file   Social Determinants of Health   Financial Resource Strain:   . Difficulty of Paying Living Expenses: Not on file  Food Insecurity:   . Worried About Programme researcher, broadcasting/film/video in the Last Year: Not on file  . Ran Out of Food in the Last Year: Not on file  Transportation Needs:   . Lack of Transportation (Medical): Not on file  . Lack of Transportation (Non-Medical): Not on file  Physical Activity:   . Days of Exercise per Week: Not on file  . Minutes of Exercise per Session: Not on file  Stress:   . Feeling of Stress : Not on file  Social Connections:   . Frequency of Communication  with Friends and Family: Not on file  . Frequency of Social Gatherings with Friends and Family: Not on file  . Attends Religious Services: Not on file  . Active Member of Clubs or Organizations: Not on file  . Attends Banker Meetings: Not on file  . Marital Status: Not on file  Intimate Partner Violence:   . Fear of Current or Ex-Partner: Not on file  . Emotionally Abused: Not on file  . Physically Abused: Not on file  . Sexually Abused: Not on file    No family history on file.   Review of Systems  Constitutional: Negative.  Negative for chills and fever.  HENT: Negative.  Negative for congestion and sore throat.   Eyes: Negative.   Respiratory: Negative.  Negative for cough and shortness of breath.   Cardiovascular: Negative.  Negative for chest pain and palpitations.  Gastrointestinal: Negative.  Negative for abdominal pain, blood in stool, diarrhea, melena, nausea and vomiting.  Genitourinary: Negative.  Negative for dysuria and hematuria.  Musculoskeletal: Negative.  Negative for back pain, myalgias and neck pain.  Skin: Negative.  Negative for rash.  Neurological: Negative.  Negative for dizziness and headaches.  All other systems reviewed and are negative.  Today's Vitals   08/30/19 0915  BP: Marland Kitchen)  114/95  Pulse: 66  Resp: 16  Temp: 98.2 F (36.8 C)  TempSrc: Oral  SpO2: 97%  Weight: 207 lb (93.9 kg)  Height: 6' 0.5" (1.842 m)   Body mass index is 27.69 kg/m.   Physical Exam Vitals reviewed.  Constitutional:      Appearance: Normal appearance.  HENT:     Head: Normocephalic.  Eyes:     Extraocular Movements: Extraocular movements intact.     Conjunctiva/sclera: Conjunctivae normal.     Pupils: Pupils are equal, round, and reactive to light.  Neck:     Vascular: No carotid bruit.  Cardiovascular:     Rate and Rhythm: Normal rate and regular rhythm.     Pulses: Normal pulses.     Heart sounds: Normal heart sounds.  Pulmonary:     Effort:  Pulmonary effort is normal.     Breath sounds: Normal breath sounds.  Abdominal:     General: There is no distension.     Palpations: Abdomen is soft.     Tenderness: There is no abdominal tenderness.  Musculoskeletal:        General: Normal range of motion.     Cervical back: Normal range of motion and neck supple.  Lymphadenopathy:     Cervical: No cervical adenopathy.  Skin:    General: Skin is warm and dry.     Capillary Refill: Capillary refill takes less than 2 seconds.  Neurological:     General: No focal deficit present.     Mental Status: He is alert and oriented to person, place, and time.  Psychiatric:        Mood and Affect: Mood normal.        Behavior: Behavior normal.      ASSESSMENT & PLAN: Tyler Miller was seen today for annual exam.  Diagnoses and all orders for this visit:  Routine general medical examination at a health care facility    Patient Instructions       If you have lab work done today you will be contacted with your lab results within the next 2 weeks.  If you have not heard from [place] then please contact us. The fastest way to get your results is to register for My Chart.   IF you received an x-ray today, you will receive an invoice from [facility]. Please contact [facility] at [phone] with questions or concerns regarding your invoice.   IF you received labwork today, you will receive an invoice from [facility]. Please contact [facility] at [phone] with questions or concerns regarding your invoice.   Our billing staff will not be able to assist you with questions regarding bills from these companies.  You will be contacted with the lab results as soon as they are available. The fastest way to get your results is to activate your My Chart account. Instructions are located on the last page of this paperwork. If you have not heard from [place] regarding the results in 2 weeks, please contact this office.      Health  Maintenance, Male Adopting a healthy lifestyle and getting preventive care are important in promoting health and wellness. Ask your health care provider about:  The right schedule for you to have regular tests and exams.  Things you can do on your own to prevent diseases and keep yourself healthy. What should I know about diet, weight, and exercise? Eat a healthy diet   Eat a diet that includes plenty of vegetables, fruits, low-fat dairy products, and  lean protein.  Do not eat a lot of foods that are high in solid fats, added sugars, or sodium. Maintain a healthy weight Body mass index (BMI) is a measurement that can be used to identify possible weight problems. It estimates body fat based on height and weight. Your health care provider can help determine your BMI and help you achieve or maintain a healthy weight. Get regular exercise Get regular exercise. This is one of the most important things you can do for your health. Most adults should:  Exercise for at least 150 minutes each week. The exercise should increase your heart rate and make you sweat (moderate-intensity exercise).  Do strengthening exercises at least twice a week. This is in addition to the moderate-intensity exercise.  Spend less time sitting. Even light physical activity can be beneficial. Watch cholesterol and blood lipids Have your blood tested for lipids and cholesterol at 39 years of age, then have this test every 5 years. You may need to have your cholesterol levels checked more often if:  Your lipid or cholesterol levels are high.  You are older than 39 years of age.  You are at high risk for heart disease. What should I know about cancer screening? Many types of cancers can be detected early and may often be prevented. Depending on your health history and family history, you may need to have cancer screening at various ages. This may include screening for:  Colorectal cancer.  Prostate cancer.  Skin  cancer.  Lung cancer. What should I know about heart disease, diabetes, and high blood pressure? Blood pressure and heart disease  High blood pressure causes heart disease and increases the risk of stroke. This is more likely to develop in people who have high blood pressure readings, are of African descent, or are overweight.  Talk with your health care provider about your target blood pressure readings.  Have your blood pressure checked: ? Every 3-5 years if you are 66-27 years of age. ? Every year if you are 19 years old or older.  If you are between the ages of 65 and 76 and are a current or former smoker, ask your health care provider if you should have a one-time screening for abdominal aortic aneurysm (AAA). Diabetes Have regular diabetes screenings. This checks your fasting blood sugar level. Have the screening done:  Once every three years after age 37 if you are at a normal weight and have a low risk for diabetes.  More often and at a younger age if you are overweight or have a high risk for diabetes. What should I know about preventing infection? Hepatitis B If you have a higher risk for hepatitis B, you should be screened for this virus. Talk with your health care provider to find out if you are at risk for hepatitis B infection. Hepatitis C Blood testing is recommended for:  Everyone born from [birth year] through 1966.  Anyone with known risk factors for hepatitis C. Sexually transmitted infections (STIs)  You should be screened each year for STIs, including gonorrhea and chlamydia, if: ? You are sexually active and are younger than 39 years of age. ? You are older than 39 years of age and your health care provider tells you that you are at risk for this type of infection. ? Your sexual activity has changed since you were last screened, and you are at increased risk for chlamydia or gonorrhea. Ask your health care provider if you are at risk.  Ask  your health care provider  about whether you are at high risk for HIV. Your health care provider may recommend a prescription medicine to help prevent HIV infection. If you choose to take medicine to prevent HIV, you should first get tested for HIV. You should then be tested every 3 months for as long as you are taking the medicine. Follow these instructions at home: Lifestyle  Do not use any products that contain nicotine or tobacco, such as cigarettes, e-cigarettes, and chewing tobacco. If you need help quitting, ask your health care provider.  Do not use street drugs.  Do not share needles.  Ask your health care provider for help if you need support or information about quitting drugs. Alcohol use  Do not drink alcohol if your health care provider tells you not to drink.  If you drink alcohol: ? Limit how much you have to 0-2 drinks a day. ? Be aware of how much alcohol is in your drink. In the [place] one drink equals one 12 oz bottle of beer (355 mL), one 5 oz glass of wine (148 mL), or one 1 oz glass of hard liquor (44 mL). General instructions  Schedule regular health, dental, and eye exams.  Stay current with your vaccines.  Tell your health care provider if: ? You often feel depressed. ? You have ever been abused or do not feel safe at home. Summary  Adopting a healthy lifestyle and getting preventive care are important in promoting health and wellness.  Follow your health care provider's instructions about healthy diet, exercising, and getting tested or screened for diseases.  Follow your health care provider's instructions on monitoring your cholesterol and blood pressure. This information is not intended to replace advice given to you by your health care provider. Make sure you discuss any questions you have with your health care provider. Document Released: 02/21/2008 Document Revised: 08/18/2018 Document Reviewed: 08/18/2018 Elsevier Patient Education  2020 Elsevier Inc.      Edwina BarthMiguel  Naylin Burkle, MD [facility]

## 2019-08-30 NOTE — Patient Instructions (Addendum)
   If you have lab work done today you will be contacted with your lab results within the next 2 weeks.  If you have not heard from us then please contact us. The fastest way to get your results is to register for My Chart.   IF you received an x-ray today, you will receive an invoice from [facility]. Please contact [facility] at [phone] with questions or concerns regarding your invoice.   IF you received labwork today, you will receive an invoice from [facility]. Please contact [facility] at [phone] with questions or concerns regarding your invoice.   Our billing staff will not be able to assist you with questions regarding bills from these companies.  You will be contacted with the lab results as soon as they are available. The fastest way to get your results is to activate your My Chart account. Instructions are located on the last page of this paperwork. If you have not heard from us regarding the results in 2 weeks, please contact this office.     Health Maintenance, Male Adopting a healthy lifestyle and getting preventive care are important in promoting health and wellness. Ask your health care provider about:  The right schedule for you to have regular tests and exams.  Things you can do on your own to prevent diseases and keep yourself healthy. What should I know about diet, weight, and exercise? Eat a healthy diet   Eat a diet that includes plenty of vegetables, fruits, low-fat dairy products, and lean protein.  Do not eat a lot of foods that are high in solid fats, added sugars, or sodium. Maintain a healthy weight Body mass index (BMI) is a measurement that can be used to identify possible weight problems. It estimates body fat based on height and weight. Your health care provider can help determine your BMI and help you achieve or maintain a healthy weight. Get regular exercise Get regular exercise. This is one of the most important things you  can do for your health. Most adults should:  Exercise for at least 150 minutes each week. The exercise should increase your heart rate and make you sweat (moderate-intensity exercise).  Do strengthening exercises at least twice a week. This is in addition to the moderate-intensity exercise.  Spend less time sitting. Even light physical activity can be beneficial. Watch cholesterol and blood lipids Have your blood tested for lipids and cholesterol at 39 years of age, then have this test every 5 years. You may need to have your cholesterol levels checked more often if:  Your lipid or cholesterol levels are high.  You are older than 40 years of age.  You are at high risk for heart disease. What should I know about cancer screening? Many types of cancers can be detected early and may often be prevented. Depending on your health history and family history, you may need to have cancer screening at various ages. This may include screening for:  Colorectal cancer.  Prostate cancer.  Skin cancer.  Lung cancer. What should I know about heart disease, diabetes, and high blood pressure? Blood pressure and heart disease  High blood pressure causes heart disease and increases the risk of stroke. This is more likely to develop in people who have high blood pressure readings, are of African descent, or are overweight.  Talk with your health care provider about your target blood pressure readings.  Have your blood pressure checked: ? Every 3-5 years if you are 18-39 years   of age. ? Every year if you are 40 years old or older.  If you are between the ages of 65 and 75 and are a current or former smoker, ask your health care provider if you should have a one-time screening for abdominal aortic aneurysm (AAA). Diabetes Have regular diabetes screenings. This checks your fasting blood sugar level. Have the screening done:  Once every three years after age 45 if you are at a normal weight and have  a low risk for diabetes.  More often and at a younger age if you are overweight or have a high risk for diabetes. What should I know about preventing infection? Hepatitis B If you have a higher risk for hepatitis B, you should be screened for this virus. Talk with your health care provider to find out if you are at risk for hepatitis B infection. Hepatitis C Blood testing is recommended for:  Everyone born from 1945 through 1965.  Anyone with known risk factors for hepatitis C. Sexually transmitted infections (STIs)  You should be screened each year for STIs, including gonorrhea and chlamydia, if: ? You are sexually active and are younger than 39 years of age. ? You are older than 39 years of age and your health care provider tells you that you are at risk for this type of infection. ? Your sexual activity has changed since you were last screened, and you are at increased risk for chlamydia or gonorrhea. Ask your health care provider if you are at risk.  Ask your health care provider about whether you are at high risk for HIV. Your health care provider may recommend a prescription medicine to help prevent HIV infection. If you choose to take medicine to prevent HIV, you should first get tested for HIV. You should then be tested every 3 months for as long as you are taking the medicine. Follow these instructions at home: Lifestyle  Do not use any products that contain nicotine or tobacco, such as cigarettes, e-cigarettes, and chewing tobacco. If you need help quitting, ask your health care provider.  Do not use street drugs.  Do not share needles.  Ask your health care provider for help if you need support or information about quitting drugs. Alcohol use  Do not drink alcohol if your health care provider tells you not to drink.  If you drink alcohol: ? Limit how much you have to 0-2 drinks a day. ? Be aware of how much alcohol is in your drink. In the [place] one drink equals one 12  oz bottle of beer (355 mL), one 5 oz glass of wine (148 mL), or one 1 oz glass of hard liquor (44 mL). General instructions  Schedule regular health, dental, and eye exams.  Stay current with your vaccines.  Tell your health care provider if: ? You often feel depressed. ? You have ever been abused or do not feel safe at home. Summary  Adopting a healthy lifestyle and getting preventive care are important in promoting health and wellness.  Follow your health care provider's instructions about healthy diet, exercising, and getting tested or screened for diseases.  Follow your health care provider's instructions on monitoring your cholesterol and blood pressure. This information is not intended to replace advice given to you by your health care provider. Make sure you discuss any questions you have with your health care provider. Document Released: 02/21/2008 Document Revised: 08/18/2018 Document Reviewed: 08/18/2018 Elsevier Patient Education  2020 [employer] Inc.  

## 2021-07-09 DIAGNOSIS — R739 Hyperglycemia, unspecified: Secondary | ICD-10-CM | POA: Diagnosis not present

## 2021-07-09 DIAGNOSIS — Z23 Encounter for immunization: Secondary | ICD-10-CM | POA: Diagnosis not present

## 2021-07-09 DIAGNOSIS — R030 Elevated blood-pressure reading, without diagnosis of hypertension: Secondary | ICD-10-CM | POA: Diagnosis not present

## 2021-07-09 DIAGNOSIS — Z0000 Encounter for general adult medical examination without abnormal findings: Secondary | ICD-10-CM | POA: Diagnosis not present

## 2021-07-26 DIAGNOSIS — E785 Hyperlipidemia, unspecified: Secondary | ICD-10-CM | POA: Diagnosis not present

## 2021-07-26 DIAGNOSIS — R030 Elevated blood-pressure reading, without diagnosis of hypertension: Secondary | ICD-10-CM | POA: Diagnosis not present
